# Patient Record
Sex: Male | Born: 1980 | Race: White | Hispanic: No | Marital: Married | Smoking: Former smoker
Health system: Southern US, Community
[De-identification: ages and names within clinical notes are randomized; demographics above are authoritative.]

---

## 2015-11-10 ENCOUNTER — Emergency Department (HOSPITAL_COMMUNITY)
Admission: EM | Admit: 2015-11-10 | Discharge: 2015-11-10 | Disposition: A | Discharge status: Home / Self Care | Attending: Emergency Medicine | Admitting: Emergency Medicine

## 2015-11-10 ENCOUNTER — Emergency Department (HOSPITAL_COMMUNITY)

## 2015-11-10 ENCOUNTER — Encounter (HOSPITAL_COMMUNITY): Payer: Self-pay | Admitting: Oncology

## 2015-11-10 DIAGNOSIS — Y998 Other external cause status: Secondary | ICD-10-CM | POA: Insufficient documentation

## 2015-11-10 DIAGNOSIS — Y9289 Other specified places as the place of occurrence of the external cause: Secondary | ICD-10-CM | POA: Insufficient documentation

## 2015-11-10 DIAGNOSIS — Y9389 Activity, other specified: Secondary | ICD-10-CM | POA: Diagnosis not present

## 2015-11-10 DIAGNOSIS — Z880 Allergy status to penicillin: Secondary | ICD-10-CM | POA: Insufficient documentation

## 2015-11-10 DIAGNOSIS — S6992XA Unspecified injury of left wrist, hand and finger(s), initial encounter: Secondary | ICD-10-CM | POA: Diagnosis present

## 2015-11-10 DIAGNOSIS — Z87891 Personal history of nicotine dependence: Secondary | ICD-10-CM | POA: Diagnosis not present

## 2015-11-10 DIAGNOSIS — S63602A Unspecified sprain of left thumb, initial encounter: Secondary | ICD-10-CM | POA: Insufficient documentation

## 2015-11-10 DIAGNOSIS — W1839XA Other fall on same level, initial encounter: Secondary | ICD-10-CM | POA: Insufficient documentation

## 2015-11-10 MED ORDER — IBUPROFEN 600 MG PO TABS: 600.0000 mg | ORAL_TABLET | Freq: Four times a day (QID) | ORAL | Status: AC | PRN

## 2015-11-10 NOTE — Discharge Instructions (Signed)
Finger Sprain A finger sprain is a tear in one of the strong, fibrous tissues that connect the bones (ligaments) in your finger. The severity of the sprain depends on how much of the ligament is torn. The tear can be either partial or complete. CAUSES  Often, sprains are a result of a fall or accident. If you extend your hands to catch an object or to protect yourself, the force of the impact causes the fibers of your ligament to stretch too much. This excess tension causes the fibers of your ligament to tear. SYMPTOMS  You may have some loss of motion in your finger. Other symptoms include:  Bruising.  Tenderness.  Swelling. DIAGNOSIS  In order to diagnose finger sprain, your caregiver will physically examine your finger or thumb to determine how torn the ligament is. Your caregiver may also suggest an X-ray exam of your finger to make sure no bones are broken. TREATMENT  If your ligament is only partially torn, treatment usually involves keeping the finger in a fixed position (immobilization) for a short period. To do this, your caregiver will apply a bandage, cast, or splint to keep your finger from moving until it heals. For a partially torn ligament, the healing process usually takes 2 to 3 weeks. If your ligament is completely torn, you may need surgery to reconnect the ligament to the bone. After surgery a cast or splint will be applied and will need to stay on your finger or thumb for 4 to 6 weeks while your ligament heals. HOME CARE INSTRUCTIONS  Keep your injured finger elevated, when possible, to decrease swelling.  To ease pain and swelling, apply ice to your joint twice a day, for 2 to 3 days:  Put ice in a plastic bag.  Place a towel between your skin and the bag.  Leave the ice on for 15 minutes.  Only take over-the-counter or prescription medicine for pain as directed by your caregiver.  Do not wear rings on your injured finger.  Do not leave your finger unprotected  until pain and stiffness go away (usually 3 to 4 weeks).  Do not allow your cast or splint to get wet. Cover your cast or splint with a plastic bag when you shower or bathe. Do not swim.  Your caregiver may suggest special exercises for you to do during your recovery to prevent or limit permanent stiffness. SEEK IMMEDIATE MEDICAL CARE IF:  Your cast or splint becomes damaged.  Your pain becomes worse rather than better. MAKE SURE YOU:  Understand these instructions.  Will watch your condition.  Will get help right away if you are not doing well or get worse.   This information is not intended to replace advice given to you by your health care provider. Make sure you discuss any questions you have with your health care provider.   Document Released: 10/30/2004 Document Revised: 10/13/2014 Document Reviewed: 05/26/2011 Elsevier Interactive Patient Education 2016 Elsevier Inc.  

## 2015-11-10 NOTE — ED Notes (Signed)
Pt c/o left thumb pain after a fall yesterday that caused his thumb to bend backwards.  Pt rates pain 1/10.  Mild edema noted to area.

## 2015-11-10 NOTE — ED Provider Notes (Signed)
CSN: [id number]     Arrival date & time 11/10/15  1936 History  By signing my name below, I, Emmanuella Mensah, attest that this documentation has been prepared under the direction and in the presence of Marlon Pel, PA-C. Electronically Signed: Angelene Giovanni, ED Scribe. 11/10/2015. 8:47 PM.    Chief Complaint  Patient presents with  . Hand Pain   The history is provided by the patient. No language interpreter was used.   HPI Comments: Larry Contreras is a 35 y.o. male who presents to the Emergency Department complaining of partially improving constant left thumb pain s/p fall that occurred yesterday. He reports associated thumb swelling. He explains that he was pushing something and it pushed back on him causing him to fall on his thumb as it was bending backwards. No LOC or head injuries during the fall. No alleviating factors noted. Pt has not taken any medications for these symptoms PTA. No fever, chills, numbness, tingling, or n/v.    History reviewed. No pertinent past medical history. History reviewed. No pertinent past surgical history. No family history on file. Social History  Substance Use Topics  . Smoking status: Former Games developer  . Smokeless tobacco: Never Used  . Alcohol Use: Yes     Comment: socially    Review of Systems  Constitutional: Negative for fever and chills.  Gastrointestinal: Negative for nausea and vomiting.  Musculoskeletal: Positive for arthralgias (left thumb).  Neurological: Negative for numbness.  All other systems reviewed and are negative.     Allergies  Penicillins  Home Medications   Prior to Admission medications   Medication Sig Start Date End Date Taking? Authorizing Provider  ibuprofen (ADVIL,MOTRIN) 600 MG tablet Take 1 tablet (600 mg total) by mouth every 6 (six) hours as needed. 11/10/15   Tylik Treese Neva Seat, PA-C   BP 125/78 mmHg  Pulse 92  Temp(Src) 97.9 F (36.6 C) (Oral)  Resp 18  SpO2 98% Physical Exam  Constitutional: He is  oriented to person, place, and time. He appears well-developed and well-nourished. No distress.  HENT:  Head: Normocephalic and atraumatic.  Eyes: Conjunctivae and EOM are normal.  Neck: Neck supple. No tracheal deviation present.  Cardiovascular: Normal rate.   Pulmonary/Chest: Effort normal. No respiratory distress.  Musculoskeletal: Normal range of motion.  Tenderness to the proximal phalanx. Mild swelling, no erythema. No fluctuance. No rash or skin injury. No deformities. NIV, CR < 2 seconds.  Neurological: He is alert and oriented to person, place, and time.  Skin: Skin is warm and dry.  Psychiatric: He has a normal mood and affect. His behavior is normal.  Nursing note and vitals reviewed.   ED Course  Procedures (including critical care time) DIAGNOSTIC STUDIES: Oxygen Saturation is 98% on RA, normal by my interpretation.    COORDINATION OF CARE: 8:43 PM- Pt advised of plan for treatment and pt agrees. Pt informed of x-ray results: x-ray shows no fracture. Pt's symptoms are consistent with a sprain. He was offered thumb spica but declined. Advised to use Ibuprofen, ice, and to stretch it out. Referral to Hand to be seen as needed. Discussed return precautions.   Imaging Review Dg Hand Complete Left  11/10/2015  CLINICAL DATA:  Left hand injury yesterday. Pt states he fell backwards and braced himself with left hand and left thumb bent backwards. Pain mostly through 1st and 2nd metacarpals. EXAM: LEFT HAND - COMPLETE 3+ VIEW COMPARISON:  None. FINDINGS: There is no evidence of fracture or dislocation. There is no evidence  of arthropathy or other focal bone abnormality. Soft tissues are unremarkable. IMPRESSION: Negative. Electronically Signed   By: Norva Pavlov M.D.   On: 11/10/2015 20:06   Marlon Pel, PA-C has personally reviewed and evaluated these images as part of her medical decision-making.   MDM   Final diagnoses:  Thumb sprain, left, initial encounter    I  personally performed the services described in this documentation, which was scribed in my presence. The recorded information has been reviewed and is accurate.   Marlon Pel, PA-C 11/10/15 2100  Margarita Grizzle, MD 11/14/15 1352

## 2016-08-03 IMAGING — CR DG HAND COMPLETE 3+V*L*
3 series · 3 of 3 positions shown · non-contrast
Comparison: None.

CLINICAL DATA: Left hand injury yesterday. Pt states he fell
backwards and braced himself with left hand and left thumb bent
backwards. Pain mostly through 1st and 2nd metacarpals.

EXAM:
LEFT HAND - COMPLETE 3+ VIEW

[x hand pa left]
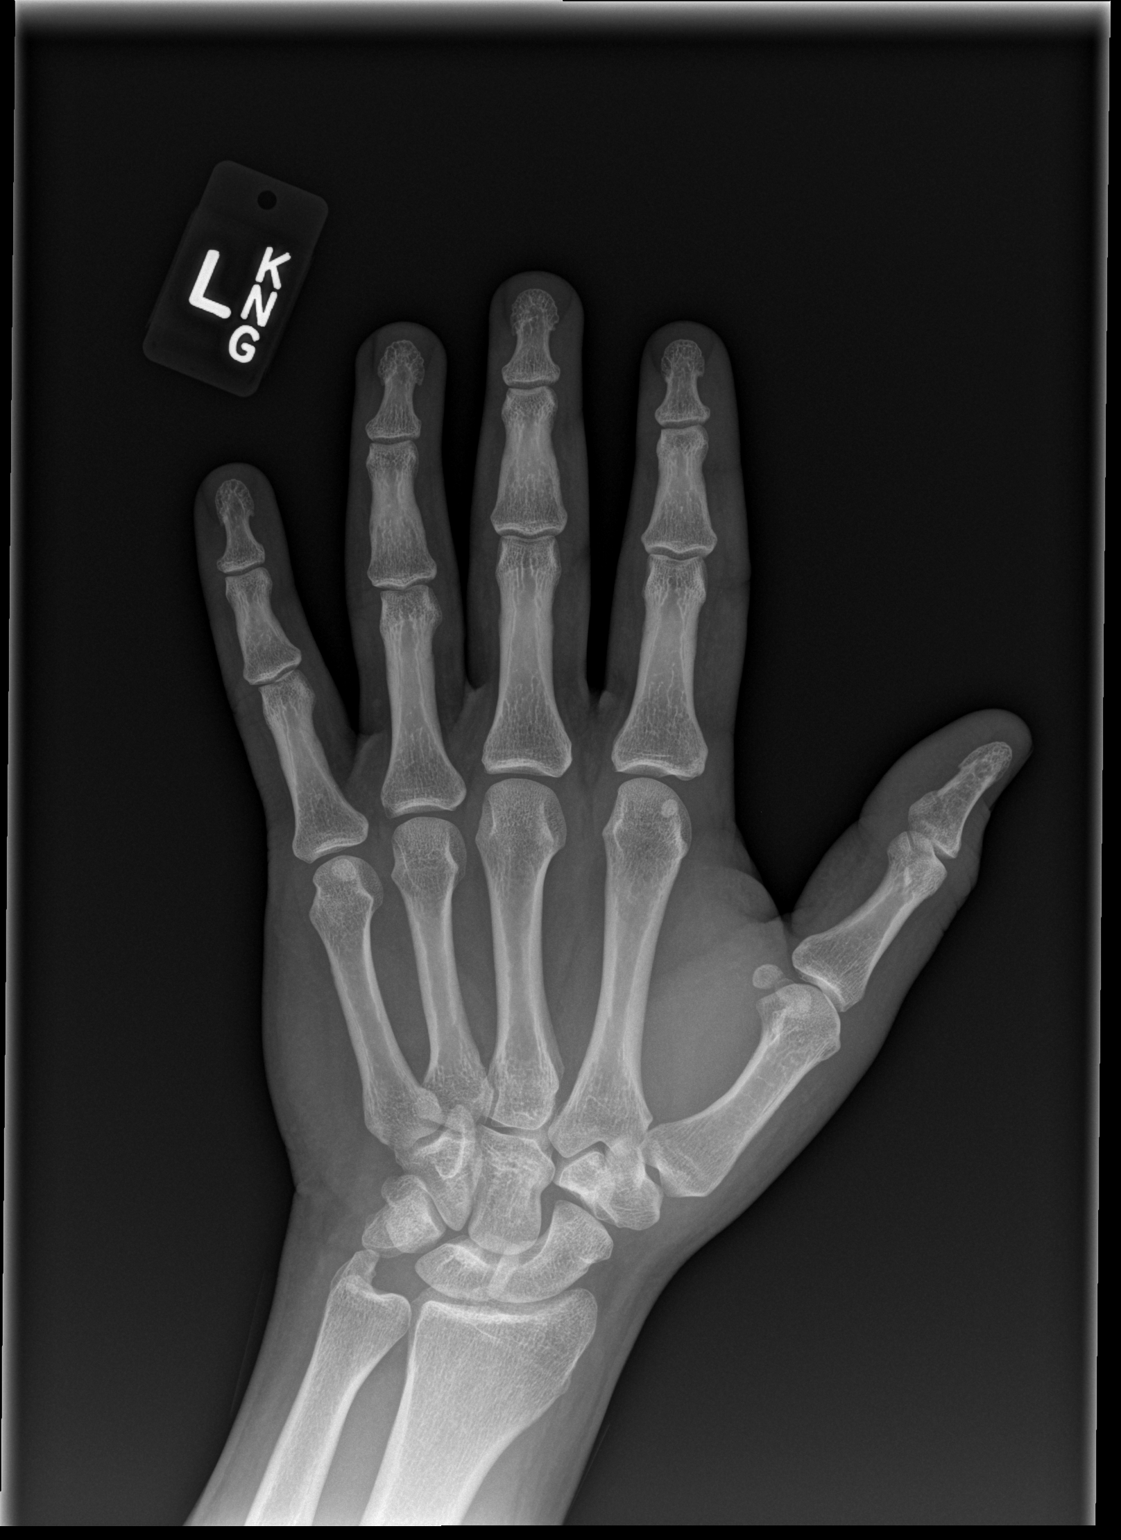

[x hand obl left]
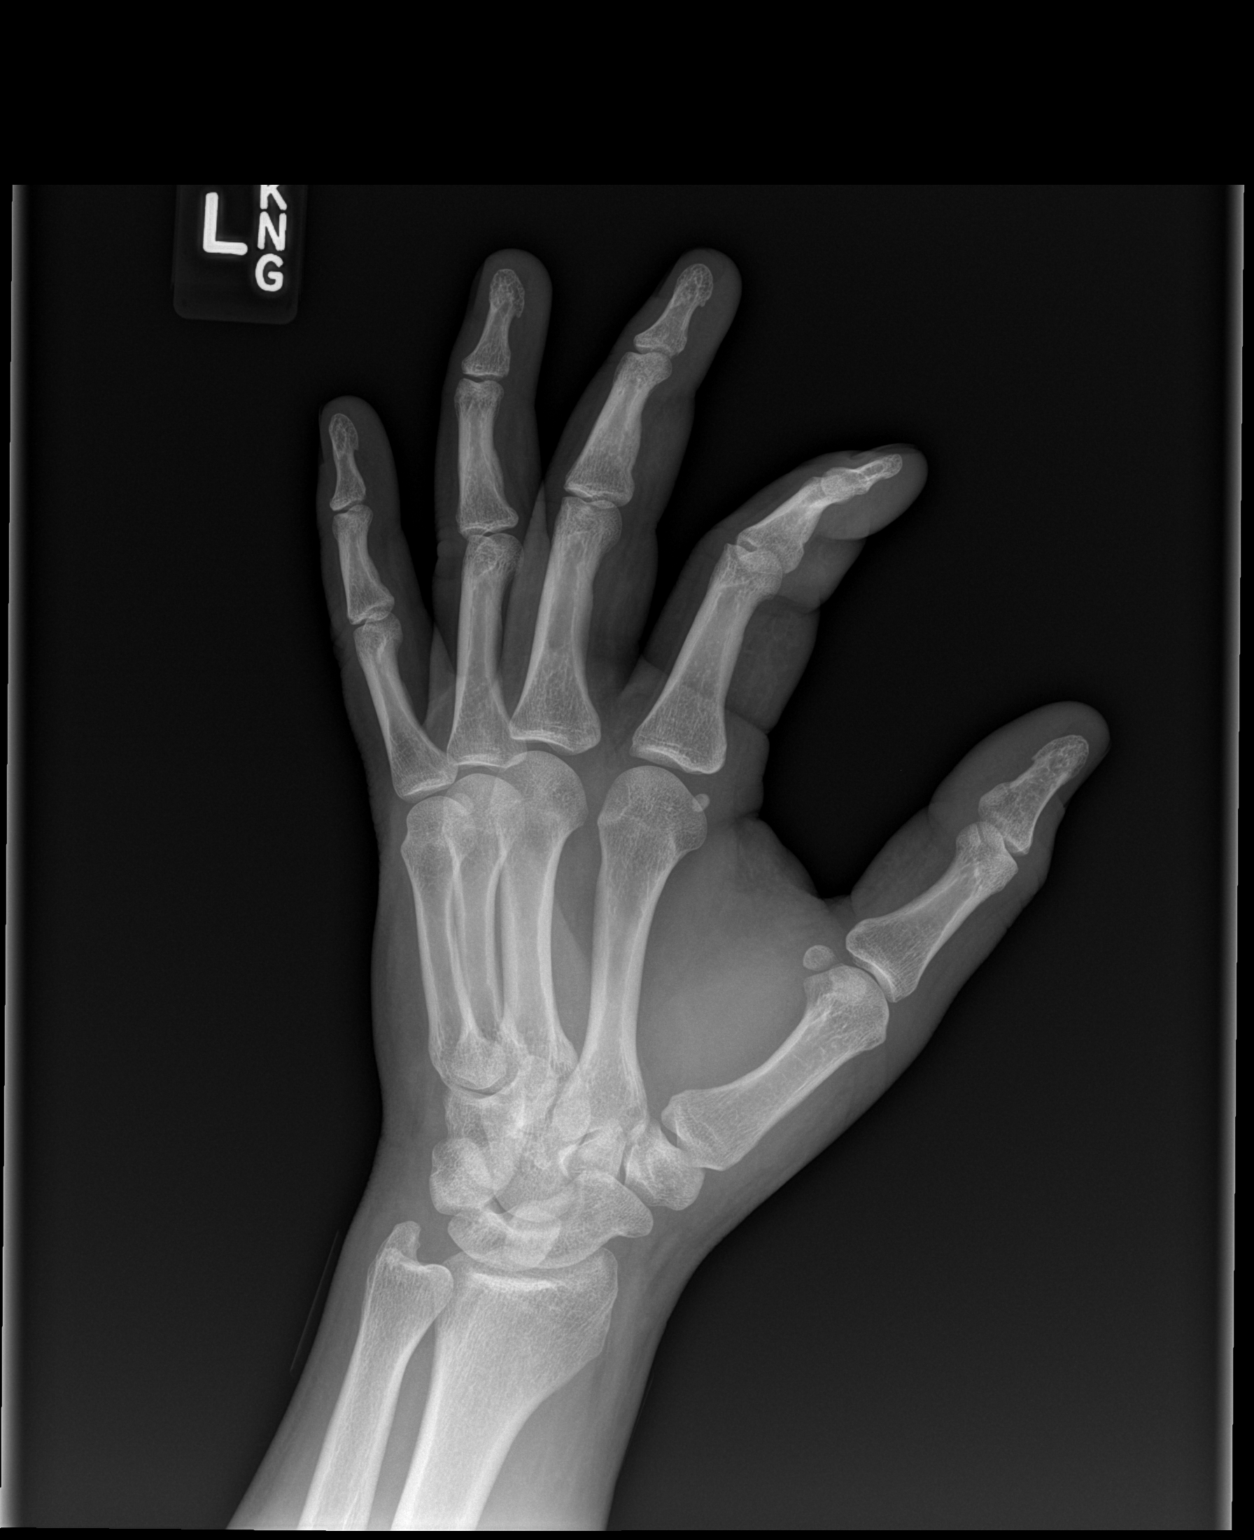

[x hand lat left]
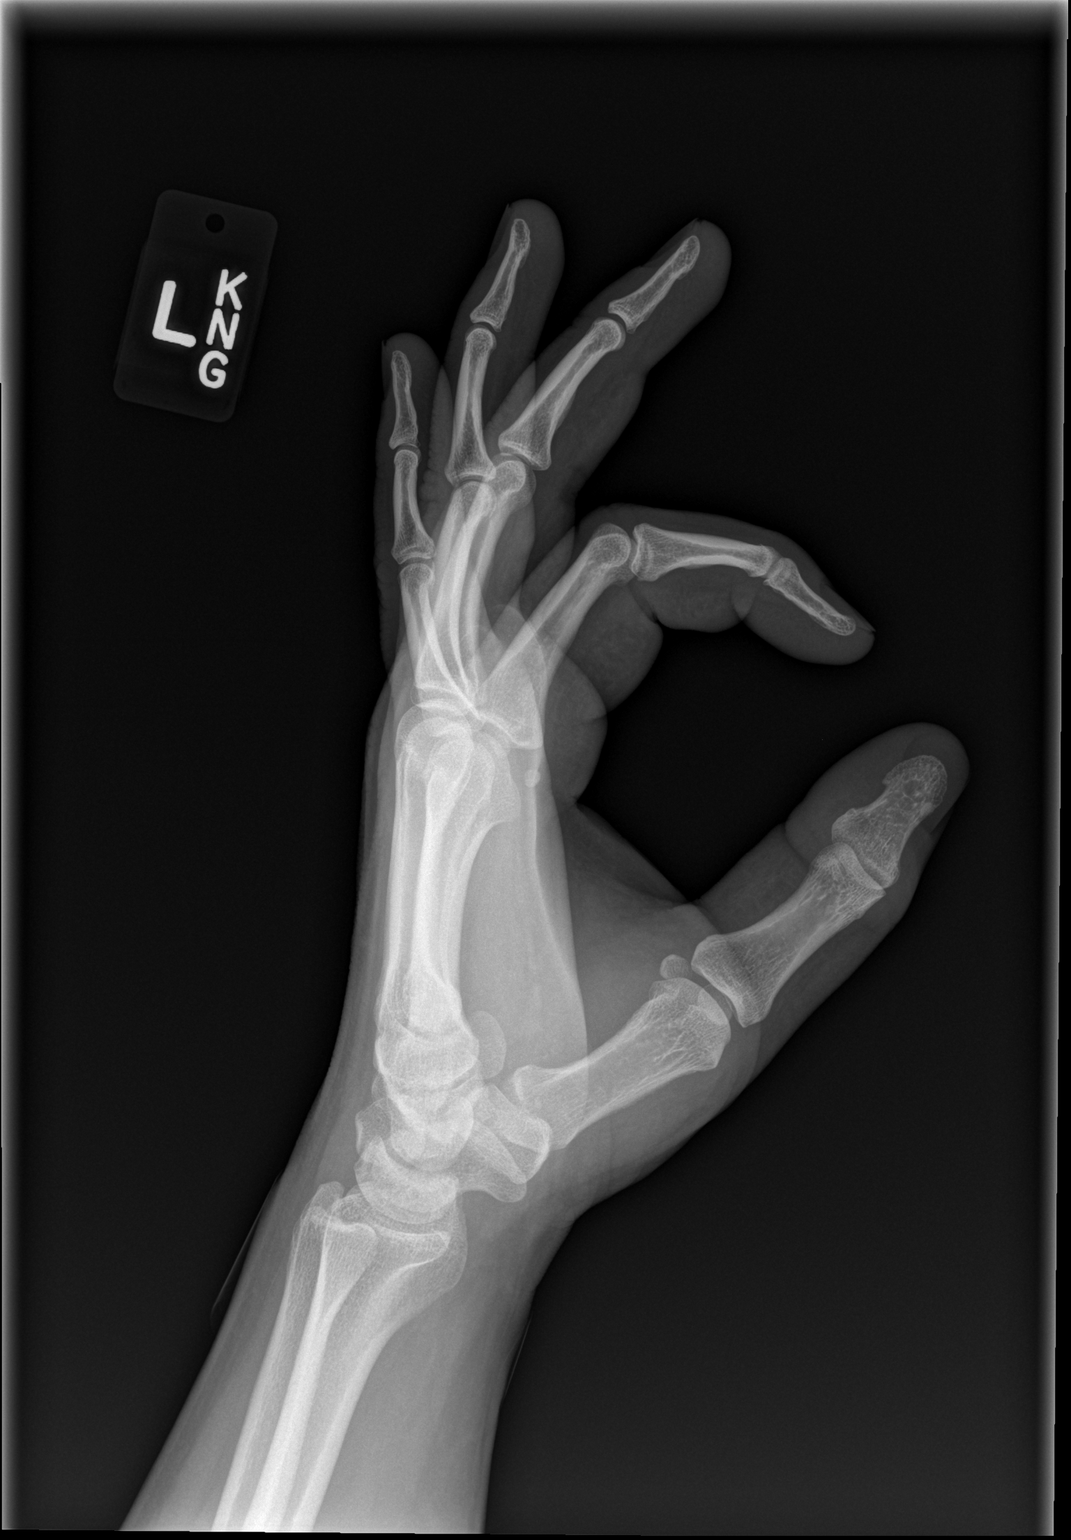

[3 of 3 positions shown; findings below may reference images not displayed]

FINDINGS: There is no evidence of fracture or dislocation. There is no
evidence of arthropathy or other focal bone abnormality. Soft
tissues are unremarkable.
IMPRESSION: Negative.

## 2021-04-16 ENCOUNTER — Other Ambulatory Visit (HOSPITAL_BASED_OUTPATIENT_CLINIC_OR_DEPARTMENT_OTHER): Payer: Self-pay

## 2021-04-16 MED ORDER — ARMODAFINIL 250 MG PO TABS
ORAL_TABLET | ORAL | 5 refills | Status: AC
  Filled 2021-04-16: qty 30, 30d supply, fill #0

## 2021-04-17 ENCOUNTER — Other Ambulatory Visit (HOSPITAL_BASED_OUTPATIENT_CLINIC_OR_DEPARTMENT_OTHER): Payer: Self-pay

## 2021-04-19 ENCOUNTER — Other Ambulatory Visit (HOSPITAL_BASED_OUTPATIENT_CLINIC_OR_DEPARTMENT_OTHER): Payer: Self-pay

## 2021-04-19 MED ORDER — ARMODAFINIL 200 MG PO TABS: ORAL_TABLET | ORAL | 5 refills | Status: AC

## 2021-04-19 MED ORDER — ARMODAFINIL 50 MG PO TABS
ORAL_TABLET | ORAL | 4 refills | Status: AC
  Filled 2021-04-19: qty 30, 30d supply, fill #0

## 2021-05-30 ENCOUNTER — Other Ambulatory Visit (HOSPITAL_BASED_OUTPATIENT_CLINIC_OR_DEPARTMENT_OTHER): Payer: Self-pay

## 2021-06-03 ENCOUNTER — Other Ambulatory Visit (HOSPITAL_BASED_OUTPATIENT_CLINIC_OR_DEPARTMENT_OTHER): Payer: Self-pay

## 2021-06-03 MED ORDER — AMPHETAMINE-DEXTROAMPHETAMINE 10 MG PO TABS
ORAL_TABLET | ORAL | 0 refills | Status: AC
  Filled 2021-06-03: qty 60, 30d supply, fill #0

## 2021-06-03 MED ORDER — ARMODAFINIL 250 MG PO TABS
ORAL_TABLET | ORAL | 5 refills | Status: AC
  Filled 2021-06-03: qty 30, 30d supply, fill #0

## 2021-06-04 ENCOUNTER — Other Ambulatory Visit (HOSPITAL_BASED_OUTPATIENT_CLINIC_OR_DEPARTMENT_OTHER): Payer: Self-pay

## 2023-08-28 ENCOUNTER — Ambulatory Visit (INDEPENDENT_AMBULATORY_CARE_PROVIDER_SITE_OTHER): Admitting: Professional Counselor

## 2023-08-28 ENCOUNTER — Encounter: Payer: Self-pay | Admitting: Professional Counselor

## 2023-08-28 DIAGNOSIS — F330 Major depressive disorder, recurrent, mild: Secondary | ICD-10-CM | POA: Diagnosis not present

## 2023-08-28 DIAGNOSIS — F411 Generalized anxiety disorder: Secondary | ICD-10-CM | POA: Diagnosis not present
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
## 2023-09-07 ENCOUNTER — Encounter: Payer: Self-pay | Admitting: Professional Counselor

## 2023-09-07 ENCOUNTER — Ambulatory Visit: Admitting: Professional Counselor

## 2023-09-07 DIAGNOSIS — F330 Major depressive disorder, recurrent, mild: Secondary | ICD-10-CM | POA: Diagnosis not present

## 2023-09-07 DIAGNOSIS — F411 Generalized anxiety disorder: Secondary | ICD-10-CM | POA: Diagnosis not present
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
## 2023-10-05 ENCOUNTER — Ambulatory Visit (INDEPENDENT_AMBULATORY_CARE_PROVIDER_SITE_OTHER): Admitting: Professional Counselor

## 2023-10-05 ENCOUNTER — Encounter: Payer: Self-pay | Admitting: Professional Counselor

## 2023-10-05 DIAGNOSIS — F411 Generalized anxiety disorder: Secondary | ICD-10-CM

## 2023-10-05 DIAGNOSIS — F330 Major depressive disorder, recurrent, mild: Secondary | ICD-10-CM | POA: Diagnosis not present

## 2023-10-05 NOTE — Progress Notes (Unsigned)
      [facility] Counselor/Therapist Progress Note  Patient ID: Larry Contreras, MRN: [id number],    Date: 10/05/2023  Time Spent: ***   Treatment Type: {CHL AMB THERAPY TYPES:[id number]}  Reported Symptoms: ***  Mental Status Exam:  Appearance:   {PSY:22683}     Behavior:  {PSY:21022743}  Motor:  {PSY:22302}  Speech/Language:   {PSY:22685}  Affect:  {PSY:22687}  Mood:  {PSY:31886}  Thought process:  {PSY:31888}  Thought content:    {PSY:[id number]}  Sensory/Perceptual disturbances:    {PSY:772-735-3887}  Orientation:  {PSY:30297}  Attention:  {PSY:22877}  Concentration:  {[phone]}  Memory:  {PSY:[id number]}  Fund of knowledge:   {[phone]}  Insight:    {[phone]}  Judgment:   {[phone]}  Impulse Control:  {[phone]}   Risk Assessment: Danger to Self:  {PSY:22692} Self-injurious Behavior: {PSY:22692} Danger to Others: {PSY:22692} Duty to Warn:{PSY:311194} Physical Aggression / Violence:{PSY:21197} Access to Firearms a concern: {PSY:21197} Gang Involvement:{PSY:21197}  Subjective: ***   Interventions: {PSY:[phone]}  Diagnosis:   ICD-10-CM   1. Generalized anxiety disorder  F41.1     2. Major depressive disorder, recurrent episode, mild (HCC)  F33.0       Plan: ***  Gaspar Bidding, [facility]

## 2023-10-22 ENCOUNTER — Encounter: Payer: Self-pay | Admitting: Professional Counselor

## 2023-10-22 ENCOUNTER — Ambulatory Visit: Admitting: Professional Counselor

## 2023-10-22 DIAGNOSIS — F330 Major depressive disorder, recurrent, mild: Secondary | ICD-10-CM | POA: Diagnosis not present

## 2023-10-22 DIAGNOSIS — F411 Generalized anxiety disorder: Secondary | ICD-10-CM | POA: Diagnosis not present
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
## 2023-11-02 ENCOUNTER — Ambulatory Visit: Admitting: Professional Counselor

## 2023-11-02 DIAGNOSIS — F330 Major depressive disorder, recurrent, mild: Secondary | ICD-10-CM | POA: Diagnosis not present

## 2023-11-02 DIAGNOSIS — F411 Generalized anxiety disorder: Secondary | ICD-10-CM

## 2023-11-04 ENCOUNTER — Other Ambulatory Visit: Payer: Self-pay | Admitting: Medical Genetics

## 2023-11-10 ENCOUNTER — Other Ambulatory Visit (HOSPITAL_COMMUNITY)
Admission: RE | Admit: 2023-11-10 | Discharge: 2023-11-10 | Disposition: A | Discharge status: Home / Self Care | Payer: Self-pay | Source: Ambulatory Visit | Attending: Oncology | Admitting: Oncology

## 2023-11-22 LAB — GENECONNECT MOLECULAR SCREEN: Genetic Analysis Overall Interpretation: NEGATIVE

## 2023-11-27 ENCOUNTER — Encounter: Payer: Self-pay | Admitting: Professional Counselor

## 2023-11-27 NOTE — Progress Notes (Signed)
      [facility] Counselor/Therapist Progress Note  Patient ID: Larry Contreras, MRN: [id number],    Date: 1.27.2025  Time Spent: 1:06 PM to 2:07 PM  Treatment Type: Individual Therapy  Reported Symptoms: Low mood, irritability, frustration, worries, trouble relaxing  Mental Status Exam:  Appearance:   Neat     Behavior:  Appropriate, Sharing, and Motivated  Motor:  Normal  Speech/Language:   Clear and Coherent and Normal Rate  Affect:  Appropriate and Congruent  Mood:  normal  Thought process:  normal  Thought content:    WNL  Sensory/Perceptual disturbances:    WNL  Orientation:  oriented to person, place, time/date, and situation  Attention:  Good  Concentration:  Good  Memory:  WNL  Fund of knowledge:   Good  Insight:    Good  Judgment:   Good  Impulse Control:  Good   Risk Assessment: Danger to Self:  No Self-injurious Behavior: No Danger to Others: No Duty to Warn:no Physical Aggression / Violence:No  Access to Firearms a concern: No  Gang Involvement:No   Subjective: Patient presented to session to address concerns of anxiety and depression.  He reported minimal progress at this time.  Patient presented to session in low spirits after engagement with peers and a professor in his schooling where he experienced a sense of targeting to his personal values.  Patient identified this as a theme he has experienced various points across his life, resulting in a sense of alienation.  Counselor affirmed patient feelings and experience, actively listened, and helped facilitate insight around patient position in conversation, and reflect on possible issue of delivery versus content as the heart of the matter, and/or mirror difference of opinion that resulted in friction.  Counselor and patient discussed different narratives and insights, and counselor encouraged patient to persevere in challenging contexts and work to not allow uncomfortable differences to dissuade him from  achieving his goals.  Interventions: Solution-Oriented/Positive Psychology, Humanistic/Existential, and Insight-Oriented  Diagnosis:   ICD-10-CM   1. Generalized anxiety disorder  F41.1     2. Major depressive disorder, recurrent episode, mild (HCC)  F33.0       Plan: Patient is scheduled for follow-up; continue process work and developing coping skills.  Patient short-term goal between sessions to reflect on rightness of his speaking of his own truth, values, opinion, while also reflecting on approach and delivery and potentially conflictual engagements.  Progress note was dictated with Dragon and reviewed for accuracy.  Gaspar Bidding, [facility]

## 2023-12-15 ENCOUNTER — Ambulatory Visit: Admitting: Professional Counselor

## 2023-12-15 ENCOUNTER — Encounter: Payer: Self-pay | Admitting: Professional Counselor

## 2023-12-15 DIAGNOSIS — F330 Major depressive disorder, recurrent, mild: Secondary | ICD-10-CM | POA: Diagnosis not present

## 2023-12-15 DIAGNOSIS — F411 Generalized anxiety disorder: Secondary | ICD-10-CM

## 2023-12-15 NOTE — Progress Notes (Signed)
      [facility] Counselor/Therapist Progress Note  Patient ID: Larry Contreras, MRN: [id number],    Date: 12/15/2023  Time Spent: 2:10 PM to 3:14 PM  Treatment Type: Individual Therapy  Reported Symptoms: Irritability, nervousness, worries, trouble relaxing, intermittent low mood, preemptive grief/loss  Mental Status Exam:  Appearance:   Neat     Behavior:  Appropriate, Sharing, and Motivated  Motor:  Normal  Speech/Language:   Clear and Coherent and Normal Rate  Affect:  Appropriate and Congruent  Mood:  normal  Thought process:  normal  Thought content:    WNL  Sensory/Perceptual disturbances:    WNL  Orientation:  oriented to person, place, time/date, and situation  Attention:  Good  Concentration:  Good  Memory:  WNL  Fund of knowledge:   Good  Insight:    Good  Judgment:   Good  Impulse Control:  Good   Risk Assessment: Danger to Self:  No Self-injurious Behavior: No Danger to Others: No Duty to Warn:no Physical Aggression / Violence:No  Access to Firearms a concern: No  Gang Involvement:No   Subjective: Patient presented to session to address concerns of anxiety, depression, and emotional dysregulation (anger).  Patient reported mixed progress.  He reported having been on vacation to see 3 of his 4 sisters, and for the trip to have gone very well.  He voiced having practiced mindfulness and CBT skills in relation to family matters that have been historically activating for him.  He reported experiencing progress with emotional regulation, but to still become agitated internally and spite of outward control.  Counselor affirmed patient proactive efforts and reinforced use of skill set.  Patient reflected on his family history, including his [place] and [place] background, and his reverence for his grandfather; he identified additional themes of emotional distancing and rigidity within family system, particularly with his grandmother.  Counselor actively listened, and  helped facilitate insight into potential impact on patient development and relational style.  Interventions: Solution-Oriented/Positive Psychology, Humanistic/Existential, and Insight-Oriented  Diagnosis:   ICD-10-CM   1. Generalized anxiety disorder  F41.1     2. Major depressive disorder, recurrent episode, mild (HCC)  F33.0       Plan: Patient is scheduled for follow-up; continue process work and developing coping skills.  Patient short-term goal between sessions to continue to practice emotional regulation, mindfulness and CBT skill set.  Progress note was dictated with Dragon and reviewed for accuracy.  Gaspar Bidding, [facility]

## 2023-12-22 ENCOUNTER — Ambulatory Visit: Admitting: Professional Counselor

## 2023-12-29 ENCOUNTER — Encounter: Payer: Self-pay | Admitting: Professional Counselor

## 2023-12-29 ENCOUNTER — Ambulatory Visit: Admitting: Professional Counselor

## 2023-12-29 DIAGNOSIS — F330 Major depressive disorder, recurrent, mild: Secondary | ICD-10-CM | POA: Diagnosis not present

## 2023-12-29 DIAGNOSIS — F411 Generalized anxiety disorder: Secondary | ICD-10-CM

## 2023-12-29 NOTE — Progress Notes (Addendum)
      [facility] Counselor/Therapist Progress Note  Patient ID: Larry Contreras, MRN: [id number],    Date: 12/29/2023  Time Spent: 1:06 PM to 2:08 PM  Treatment Type: Individual Therapy  Reported Symptoms: Nervousness, worries, irritability; intermittent low mood, anhedonia, self-esteem concerns; emotional dysregulation concerns, interpersonal concerns  Mental Status Exam:  Appearance:   Neat     Behavior:  Appropriate, Sharing, and Motivated  Motor:  Normal  Speech/Language:   Clear and Coherent and Normal Rate  Affect:  Tearful  Mood:  Sad, some agitation  Thought process:  normal  Thought content:    WNL  Sensory/Perceptual disturbances:    WNL  Orientation:  oriented to person, place, time/date, and situation  Attention:  Good  Concentration:  Good  Memory:  WNL  Fund of knowledge:   Good  Insight:    Good  Judgment:   Good  Impulse Control:  Good   Risk Assessment: Danger to Self:  No Self-injurious Behavior: No Danger to Others: No Duty to Warn:no Physical Aggression / Violence:No  Access to Firearms a concern: No  Gang Involvement:No   Subjective: Patient presented to session to address concerns of anxiety and depression, and anger management concerns.  Patient reported limited progress at this time.  He identified stress across spheres of life including academic pursuits, marriage and family, reflections on his past, and emotional dysregulation.  He processed experience of emotional dysregulation episodes since last session.  He processed experience of marital concerns, and also voiced having been reflecting on his emotions and his sense that he sometimes feels blocked from range of emotional capacities.  He voiced having been reflecting on discussion with therapy from last session regarding his grandmother and parents, and aspects of his upbringing where he identifies he could have needed increased nurture and presence from caregivers.  Counselor held space for patient  processing, and helped facilitate insight, develop strategies for interpersonal effectiveness and emotional regulation, and resourcing of patient strengths.  Counselor provided psychoeducation regarding definitions of anger, hostility and aggression and discussed with patient, and helped develop patient mindfulness of agency in the moment, and resourcing of self and other compassion per concept of common humanity.  Counselor helped to instill hope.  Counselor discussed possibility of couples counseling with patient.  Interventions: Solution-Oriented/Positive Psychology, Humanistic/Existential, and Insight-Oriented, Psychoeducation, Anger Management  Diagnosis:   ICD-10-CM   1. Generalized anxiety disorder  F41.1     2. Major depressive disorder, recurrent episode, mild (HCC)  F33.0       Plan: Patient is scheduled for follow-up; continue process work and developing coping skills.  Patient short-term goal between sessions to consider couples counseling, prioritize short-term and achievable goals to improve mood stability including working out, and continue to reflect on range of emotions including those underlying emotionally dysregulation states.  Progress note was dictated with Dragon and reviewed for accuracy.  Anthon Kins, [facility]

## 2024-01-05 ENCOUNTER — Encounter: Payer: Self-pay | Admitting: Professional Counselor

## 2024-01-05 ENCOUNTER — Ambulatory Visit (INDEPENDENT_AMBULATORY_CARE_PROVIDER_SITE_OTHER): Admitting: Professional Counselor

## 2024-01-05 DIAGNOSIS — F330 Major depressive disorder, recurrent, mild: Secondary | ICD-10-CM | POA: Diagnosis not present

## 2024-01-05 DIAGNOSIS — F411 Generalized anxiety disorder: Secondary | ICD-10-CM | POA: Diagnosis not present

## 2024-01-05 NOTE — Progress Notes (Signed)
      [facility] Counselor/Therapist Progress Note  Patient ID: Larry Contreras, MRN: [id number],    Date: 01/05/2024  Time Spent: 1:08 PM to 2:11 PM  Treatment Type: Individual Therapy  Reported Symptoms: Muscle tension, sense of bland feeling, frustration, agitation upon social cues, worries, interpersonal concerns  Mental Status Exam:  Appearance:   Neat     Behavior:  Appropriate, Sharing, and Motivated  Motor:  Normal  Speech/Language:   Clear and Coherent and Normal Rate  Affect:  Appropriate and Congruent  Mood:  normal  Thought process:  normal  Thought content:    WNL  Sensory/Perceptual disturbances:    WNL  Orientation:  oriented to person, place, time/date, and situation  Attention:  Good  Concentration:  Good  Memory:  WNL  Fund of knowledge:   Good  Insight:    Good  Judgment:   Good  Impulse Control:  Good   Risk Assessment: Danger to Self:  No Self-injurious Behavior: No Danger to Others: No Duty to Warn:no Physical Aggression / Violence:No  Access to Firearms a concern: No  Gang Involvement:No   Subjective: Patient presented to session to address concerns of anxiety and depression.  He reported mixed progress at this time.  He reflected on his desires for the future, particularly as relates ideas about future employment and/or retirement, and his marriage.  He voiced having confusing feelings about marital dynamics at this time.  Patient reported having taken suggestion from last session to take a walk after session to center and reflect.  Counselor actively listened and helped facilitate insight around patient concerns.  Patient identified internalizing reflecting and practicing therapeutic skills and to find them helpful, and that his schooling helps, too.  He reported working to emotionally regulate, and counselor reinforced patient proactive efforts as relate healthy and safe release of tension and aggravation.  Patient explored possible origin of some of his  need for social space and easy frustration in public, particularly as relates his father's demeanor at times.  Counselor helped with strategies such as interrupting activation by attuning with others and adopting a stance of friendliness versus hostility.  Interventions: Solution-Oriented/Positive Psychology, Humanistic/Existential, and Insight-Oriented, Anger Management  Diagnosis:   ICD-10-CM   1. Generalized anxiety disorder  F41.1     2. Major depressive disorder, recurrent episode, mild (HCC)  F33.0       Plan: Patient is scheduled for follow-up; continue process work and developing coping skills.  Patient short-term personal goal between sessions to practice adopting friendly stance when faced with potential for social agitation.  Progress note was dictated with Dragon and reviewed for accuracy.  Anthon Kins, [facility]

## 2024-01-27 ENCOUNTER — Encounter: Payer: Self-pay | Admitting: Professional Counselor

## 2024-01-27 ENCOUNTER — Ambulatory Visit (INDEPENDENT_AMBULATORY_CARE_PROVIDER_SITE_OTHER): Admitting: Professional Counselor

## 2024-01-27 DIAGNOSIS — F411 Generalized anxiety disorder: Secondary | ICD-10-CM

## 2024-01-27 DIAGNOSIS — F330 Major depressive disorder, recurrent, mild: Secondary | ICD-10-CM | POA: Diagnosis not present

## 2024-01-27 NOTE — Progress Notes (Signed)
      [facility] Counselor/Therapist Progress Note  Patient ID: Larry Contreras, MRN: [id number],    Date: 01/27/2024  Time Spent: 10:13 AM to 11:15 AM  Treatment Type: Individual Therapy  Reported Symptoms: Restlessness, irritability, low mood, worries, interpersonal concerns, emotional dysregulation concerns  Mental Status Exam:  Appearance:   Neat     Behavior:  Appropriate and Sharing  Motor:  Normal  Speech/Language:   Clear and Coherent and Normal Rate  Affect:  Appropriate and Congruent  Mood:  normal  Thought process:  normal  Thought content:    WNL  Sensory/Perceptual disturbances:    WNL  Orientation:  oriented to person, place, time/date, and situation  Attention:  Good  Concentration:  Good  Memory:  WNL  Fund of knowledge:   Good  Insight:    Good  Judgment:   Good  Impulse Control:  Good   Risk Assessment: Danger to Self:  No Self-injurious Behavior: No Danger to Others: No Duty to Warn:no Physical Aggression / Violence:No  Access to Firearms a concern: No  Gang Involvement:No   Subjective: Patient presented to session to address concerns of anxiety depression and anger management.  He reported mixed progress at this time.  Patient processed experience of difficult communications with his wife around issue of marital challenges.  Patient voiced desire to move forward in marriage optimally, and reconnect with the good things about their relationship, and to identify what it is he truly wants, and what he wishes to model in his family life particularly as a parent.  He voiced wife to be starting therapy as well, which he feels will help, too.  Counselor recommended couples counseling, and encouraged patient to maintain hopefulness and a long view.  Patient processed feedback from his spouse regarding "softening" his approach, and counselor and patient discussed strategies to this effect.  Counselor encouraged patient to be mindful of and pull back from activating  comments, and to practice friendliness across spheres of life as a way to diffuse internal agitation and tension and/or interpersonal escalation.  Interventions: Solution-Oriented/Positive Psychology, Humanistic/Existential, Insight-Oriented, and Anger Management  Diagnosis:   ICD-10-CM   1. Generalized anxiety disorder  F41.1     2. Major depressive disorder, recurrent episode, mild (HCC)  F33.0       Plan: Patient is scheduled for follow-up; continue process work and developing coping skills.  Patient short-term goal between sessions to be mindful of non-activating approach to interpersonal interactions, practice friendliness, and practice self-regulation skills.  Progress note was dictated with Dragon and reviewed for accuracy.  Anthon Kins, [facility]

## 2024-02-11 ENCOUNTER — Ambulatory Visit: Admitting: Professional Counselor

## 2024-02-16 ENCOUNTER — Ambulatory Visit: Admitting: Professional Counselor

## 2024-03-02 ENCOUNTER — Ambulatory Visit (INDEPENDENT_AMBULATORY_CARE_PROVIDER_SITE_OTHER): Admitting: Professional Counselor

## 2024-03-02 ENCOUNTER — Encounter: Payer: Self-pay | Admitting: Professional Counselor

## 2024-03-02 DIAGNOSIS — F411 Generalized anxiety disorder: Secondary | ICD-10-CM

## 2024-03-02 NOTE — Progress Notes (Signed)
      [facility] Counselor/Therapist Progress Note  Patient ID: Larry Contreras, MRN: [id number],    Date: 03/02/2024  Time Spent: 10:13 AM to 11:18 AM  Treatment Type: Individual Therapy  Reported Symptoms: Muscle tension, nervousness, restlessness, irritability, sense of overwhelm, anger concerns  Mental Status Exam:  Appearance:   Neat     Behavior:  Appropriate, Sharing, and Motivated  Motor:  Normal  Speech/Language:   Clear and Coherent and Normal Rate  Affect:  Appropriate and Congruent  Mood:  normal  Thought process:  normal  Thought content:    WNL  Sensory/Perceptual disturbances:    WNL  Orientation:  oriented to person, place, time/date, and situation  Attention:  Good  Concentration:  Good  Memory:  WNL  Fund of knowledge:   Good  Insight:    Good  Judgment:   Good  Impulse Control:  Good   Risk Assessment: Danger to Self:  No Self-injurious Behavior: No Danger to Others: No Duty to Warn:no Physical Aggression / Violence:No  Access to Firearms a concern: No  Gang Involvement:No   Subjective: Patient presented to session to address concerns of anxiety and anger management concerns.  He reported mixed progress at this time.  He reported practicing cognitive restructuring around experience of irritations, and working to let go, and being mindful about an empathetic approach to situations.  He reported improved communications with his immediate and extended family.  Counselor affirmed and reinforced patient proactive use of skills.  Patient reflected on his sense of his EQ and counselor actively listened and helped facilitate insight around patient concerns.  Counselor and patient discussed egoic versus collaborative orientations and choices, and counselor provided psychoeducation regarding your neurons, attunement, and assisted patient with developing strategies for coping with anger impulses pertaining to interactions with others.  Interventions:  Solution-Oriented/Positive Psychology, Humanistic/Existential, Psycho-education/Bibliotherapy, and Insight-Oriented  Diagnosis:   ICD-10-CM   1. Generalized anxiety disorder  F41.1       Plan: Patient is scheduled for follow-up; continue process work and developing coping skills.  STG between sessions to continue to practice coping skills and to reflect on self awareness and insights discussed in session.  Progress note was dictated with Dragon and reviewed for accuracy.  Larry Contreras, [facility]

## 2024-03-15 ENCOUNTER — Ambulatory Visit: Admitting: Professional Counselor

## 2024-03-15 ENCOUNTER — Encounter: Payer: Self-pay | Admitting: Professional Counselor

## 2024-03-15 DIAGNOSIS — F330 Major depressive disorder, recurrent, mild: Secondary | ICD-10-CM

## 2024-03-15 DIAGNOSIS — F411 Generalized anxiety disorder: Secondary | ICD-10-CM

## 2024-03-15 NOTE — Progress Notes (Signed)
      [facility] Counselor/Therapist Progress Note  Patient ID: Larry Contreras, MRN: [id number],    Date: 03/15/2024  Time Spent: 11:16 AM - 12:20 PM   Treatment Type: Individual Therapy  Reported Symptoms: restlessness, worries, nervousness, hypervigilance, irritability, emotional dysregulation, fatigue, unrefreshing sleep, fogginess  Mental Status Exam:  Appearance:   Neat     Behavior:  Appropriate, Sharing, and Motivated  Motor:  Normal  Speech/Language:   Clear and Coherent and Normal Rate  Affect:  Appropriate and Congruent  Mood:  normal  Thought process:  normal  Thought content:    WNL  Sensory/Perceptual disturbances:    WNL  Orientation:  oriented to person, place, time/date, and situation  Attention:  Good  Concentration:  Good  Memory:  WNL  Fund of knowledge:   Good  Insight:    Good  Judgment:   Good  Impulse Control:  Good   Risk Assessment: Danger to Self:  No Self-injurious Behavior: No Danger to Others: No Duty to Warn:no Physical Aggression / Violence:No  Access to Firearms a concern: No  Gang Involvement:No   Subjective: Patient presented to session to address concerns of anxiety and depression.  He reported mixed progress at this time.  Patient reported medications to possibly contributing to fatigue, fogginess and unrefreshed sleep.  He reported not feeling like himself.  Patient reflected on session being 10th with counselor and reflected on what he feels are his takeaways thus far.  He identified helpfulness of therapy in increasing how he conceptualizes his sense of his personal space and his experience of anger, how he approaches others, and an increase in awareness about impact and reflection of life experiences.  Counselor affirmed patient feelings and experience and shared her sense of patient progress including patient voicing of increase in capacity for emotional regulation and comfort in social interactions, openness to past/depth work,  and engagement in therapeutic relationship.  Counselor and patient continue to discuss internal activation around agitating social cues, and discussed patient concern regarding social anxiety and/or agoraphobia.  Counselor assessed symptoms with patient and voiced intention to continue to track with patient.  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  Counselor helped to facilitate insight and resource strengths and coping skills.  Interventions: Solution-Oriented/Positive Psychology, Humanistic/Existential, Insight-Oriented, and Anger Management, MBSR   Diagnosis:   ICD-10-CM   1. Generalized anxiety disorder  F41.1     2. Major depressive disorder, recurrent episode, mild (HCC)  F33.0       Plan: Pt is scheduled for a follow-up; continue process work and developing coping skills. Continue to discuss symptoms of agoraphobia and trauma response pattern. Pt STG between sessions to work on grounding preventively ahead of potentially triggering scenarios per breathwork techniques such as elevator breathing or body scan as discussed in session, practice visualization of boundaries and practicing mindfulness around choices to deescalate in the moment, reflect on his experience of relational traumas by hx and impact to development and well-being, including as relate his experience of altercations.  [name], [facility]

## 2024-03-29 ENCOUNTER — Ambulatory Visit: Admitting: Professional Counselor

## 2024-05-04 ENCOUNTER — Ambulatory Visit (INDEPENDENT_AMBULATORY_CARE_PROVIDER_SITE_OTHER): Admitting: Professional Counselor

## 2024-05-04 ENCOUNTER — Encounter: Payer: Self-pay | Admitting: Professional Counselor

## 2024-05-04 DIAGNOSIS — F411 Generalized anxiety disorder: Secondary | ICD-10-CM

## 2024-05-04 DIAGNOSIS — F330 Major depressive disorder, recurrent, mild: Secondary | ICD-10-CM | POA: Diagnosis not present

## 2024-05-04 NOTE — Progress Notes (Signed)
      [facility] Counselor/Therapist Progress Note  Patient ID: Larry Contreras, MRN: [id number],    Date: 05/04/2024  Time Spent: 3:04 PM to 4:06 PM  Treatment Type: Individual Therapy  Reported Symptoms: Nervousness, anxiousness, worries, trouble relaxing, irritability, intermittent low mood, fatigue  Mental Status Exam:  Appearance:   Neat     Behavior:  Appropriate, Sharing, and Motivated  Motor:  Normal  Speech/Language:   Clear and Coherent and Normal Rate  Affect:  Appropriate and Congruent  Mood:  normal  Thought process:  normal  Thought content:    WNL  Sensory/Perceptual disturbances:    WNL  Orientation:  oriented to person, place, time/date, and situation  Attention:  Good  Concentration:  Good  Memory:  WNL  Fund of knowledge:   Good  Insight:    Good  Judgment:   Good  Impulse Control:  Good   Risk Assessment: Danger to Self:  No Self-injurious Behavior: No Danger to Others: No Duty to Warn:no Physical Aggression / Violence:No  Access to Firearms a concern: No  Gang Involvement:No   Subjective: Patient presented to session to address concerns of anxiety and depression.  He reported mixed progress at this time.  Patient reported to be paying more attention to himself somatically, and how his attentiveness allows him to reset and find balance as needed.  He reflected on content from last session, voicing that he remembered times when people approached him and he may have not met others halfway; he voiced intention to continue to practice interpersonal effectiveness skills.  Patient reported his incidence of anger while driving to have improved, as he is practicing letting go of frustrations.  He reported working to sit with grief, and processed his relationship with his father by history, and other family members.  Counselor affirmed and reinforced patient proactive efforts, reflections and skill use, and helps to facilitate further insight.  Counselor and patient  discussed patient tendency for relational vigilance, and his sensory orientation, including as relates to his narcolepsy.  Counselor encouraged patient continued use of therapeutic journaling.  Interventions: Solution-Oriented/Positive Psychology, Humanistic/Existential, and Insight-Oriented  Diagnosis:   ICD-10-CM   1. Generalized anxiety disorder  F41.1     2. Major depressive disorder, recurrent episode, mild (HCC)  F33.0       Plan: Patient is scheduled for follow-up; continue process work and developing coping skills.  STG between sessions for patient to continue to reflect on self conceptualization and practice positive reframes, and continue self awareness around self/other projections.  Continue therapeutic journaling.  [name], [facility]

## 2024-05-18 ENCOUNTER — Encounter: Payer: Self-pay | Admitting: Professional Counselor

## 2024-05-18 ENCOUNTER — Ambulatory Visit (INDEPENDENT_AMBULATORY_CARE_PROVIDER_SITE_OTHER): Admitting: Professional Counselor

## 2024-05-18 DIAGNOSIS — F330 Major depressive disorder, recurrent, mild: Secondary | ICD-10-CM | POA: Diagnosis not present

## 2024-05-18 DIAGNOSIS — F411 Generalized anxiety disorder: Secondary | ICD-10-CM

## 2024-05-18 NOTE — Progress Notes (Signed)
      [facility] Counselor/Therapist Progress Note  Patient ID: Larry Contreras, MRN: [id number],    Date: 05/18/2024  Time Spent: 1:06 PM to 2:15 PM  Treatment Type: Individual Therapy  Reported Symptoms: Sadness, low mood, anxiousness, worries, trouble relaxing, irritability, interpersonal concerns, phase of life concerns, fatigue, self-esteem concerns  Mental Status Exam:  Appearance:   Neat     Behavior:  Appropriate and Sharing, Motivated  Motor:  Normal  Speech/Language:   Clear and Coherent and Normal Rate  Affect:  Appropriate and Congruent  Mood:  sad, frustrated  Thought process:  normal  Thought content:    WNL  Sensory/Perceptual disturbances:    WNL  Orientation:  oriented to person, place, time/date, and situation  Attention:  Good  Concentration:  Good  Memory:  WNL  Fund of knowledge:   Good  Insight:    Good  Judgment:   Good  Impulse Control:  Good   Risk Assessment: Danger to Self:  No Self-injurious Behavior: No Danger to Others: No Duty to Warn:no Physical Aggression / Violence:No  Access to Firearms a concern: No  Gang Involvement:No   Subjective: Patient presented to session to address concerns of anxiety and depression.  He reported mixed progress at this time.  He processed the experience of therapeutic journaling and voiced desire to explore more deeply in his therapy sessions.  He processed the experience of friendships by history, his relationship with his daughter particularly in her youth and challenges they have faced, his relationship with his father and ways of being and values he imparted, and lasting impact.  Counselor actively listened, affirmed patient feelings and experience, and helped facilitate insight into patient conceptualization of events and to encourage cognitive restructuring within varying perspectives. Counselor and patient discussed daughter phase of life by history, and her current bids for increased closeness, and strategies  to nurture growth and healing between them. Counselor and patient discussed patient forgiveness work as relates self and other compassion, and patient resourcing his sense of hope, voice, and life experience to help others in their intra/interpersonal journeys as well.   Interventions: Solution-Oriented/Positive Psychology, Humanistic/Existential, and Insight-Oriented  Diagnosis:   ICD-10-CM   1. Generalized anxiety disorder  F41.1     2. Major depressive disorder, recurrent episode, mild (HCC)  F33.0       Plan: Patient is scheduled for follow-up; continue process work and developing coping skills including grounding techniques skill set.  STG between sessions for patient to reflect on intrapersonal work of session and practice cognitive restructuring regarding specific narratives.  Larry Contreras, [facility]

## 2024-06-01 ENCOUNTER — Encounter: Payer: Self-pay | Admitting: Professional Counselor

## 2024-06-01 ENCOUNTER — Ambulatory Visit (INDEPENDENT_AMBULATORY_CARE_PROVIDER_SITE_OTHER): Admitting: Professional Counselor

## 2024-06-01 DIAGNOSIS — F411 Generalized anxiety disorder: Secondary | ICD-10-CM | POA: Diagnosis not present

## 2024-06-01 DIAGNOSIS — F330 Major depressive disorder, recurrent, mild: Secondary | ICD-10-CM | POA: Diagnosis not present

## 2024-06-01 NOTE — Progress Notes (Signed)
 [facility] Counselor/Therapist Progress Note  Patient ID: Larry Contreras, MRN: [id number],    Date: 06/01/2024  Time Spent: 10:10 AM to 11:12 AM  Treatment Type: Individual Therapy  Reported Symptoms: Fatigue, sadness, low mood, worries, interpersonal concerns, emotional dysregulation, irritability, frustration, trouble relaxing  Mental Status Exam:  Appearance:   Neat     Behavior:  Appropriate, Sharing, and Motivated  Motor:  Normal  Speech/Language:   Clear and Coherent and Normal Rate  Affect:  Tearful  Mood:  sad  Thought process:  normal  Thought content:    WNL  Sensory/Perceptual disturbances:    WNL  Orientation:  oriented to person, place, time/date, and situation  Attention:  Good  Concentration:  Good  Memory:  WNL  Fund of knowledge:   Good  Insight:    Good  Judgment:   Good  Impulse Control:  Good   Risk Assessment: Danger to Self:  No Self-injurious Behavior: No Danger to Others: No Duty to Warn:no Physical Aggression / Violence:No  Access to Firearms a concern: No  Gang Involvement:No   Subjective: Patient presented to session to address concerns of anxiety and depression.  He reported mixed progress at this time.  Patient reported school to have started again and to be going well, and to be working on a genogram.  He shared regarding a family conflict between himself and his son that then became between him and his wife.  He voiced frustration over the circumstance, and exasperation regarding times when he feels he tries to do his best, and regardless it seems hard to turn the tide of certain negative dynamics in the family system.  He expressed emotional confrontation was very tiring, and reflected on his capacity to compartmentalize emotions and experiences out of necessity.  Counselor assisted patient in identifying coping alternatives to interrupt potential emotional dysregulation preventatively.  Patient reflected on his father, and how accomplished  he was, and his reverence for his father's memory, and grief/lost for sense of missed closeness.  Counselor actively listened, affirmed patient feelings and experience, and helped to facilitate insight into patient coping mechanisms over time, possible limitations to patient being able to express feelings and be present by virtue of he and father's relational distance at times in patient's life, and helped patient to resource self and other compassion where complex father-son relationship is concerned, and to reinforce patient's trust in his own capacity to feel and to move through emotional states.    Interventions: Solution-Oriented/Positive Psychology, Humanistic/Existential, and Insight-Oriented, Resourcing  Diagnosis:   ICD-10-CM   1. Generalized anxiety disorder  F41.1     2. Major depressive disorder, recurrent episode, mild (HCC)  F33.0       Plan: Patient is scheduled for follow-up; continue process work and developing coping skills.  STG between sessions for patient to consider interrupting potential emotional dysregulation by resourcing distress tolerance skills of relaxation techniques, distracting techniques such as taking a walk, drinking a glass of water, or other healthy physical outlets, even brief such as a yoga sun salutation sequence.  Patient to continue to reflect on his relationship with his father, and utilizing positive self affirmations regarding his capacity to cope with and respond to his and others emotions effectively.  Patient to consider taking a walk in the sunshine after session to reflect on significant process work, and resource a self-care break.  [name], [facility]

## 2024-06-16 ENCOUNTER — Ambulatory Visit (INDEPENDENT_AMBULATORY_CARE_PROVIDER_SITE_OTHER): Admitting: Professional Counselor

## 2024-06-16 DIAGNOSIS — F411 Generalized anxiety disorder: Secondary | ICD-10-CM | POA: Diagnosis not present

## 2024-06-16 DIAGNOSIS — F330 Major depressive disorder, recurrent, mild: Secondary | ICD-10-CM | POA: Diagnosis not present

## 2024-06-16 NOTE — Progress Notes (Addendum)
      [facility] Counselor/Therapist Progress Note  Patient ID: Larry Contreras, MRN: [id number],    Date: 06/16/2024  Time Spent: 9:10 AM to 10:08 AM  Treatment Type: Individual Therapy  Reported Symptoms: Anxiousness, worries, irritability, stress, intermittent low mood, fatigue  Mental Status Exam:  Appearance:   Neat     Behavior:  Appropriate and Sharing  Motor:  Normal  Speech/Language:   Clear and Coherent and Normal Rate  Affect:  Appropriate and Congruent  Mood:  normal  Thought process:  normal  Thought content:    WNL  Sensory/Perceptual disturbances:    WNL  Orientation:  oriented to person, place, time/date, and situation  Attention:  Good  Concentration:  Good  Memory:  WNL  Fund of knowledge:   Good  Insight:    Good  Judgment:   Good  Impulse Control:  Good   Risk Assessment: Danger to Self:  No Self-injurious Behavior: No Danger to Others: No Duty to Warn:no Physical Aggression / Violence:No  Access to Firearms a concern: No  Gang Involvement:No   Subjective: Patient presented to session to address concerns of anxiety and depression.  He reported progress.  He processed experience of emotional catharsis last session, and counselor helped facilitate insight.  Patient explored thoughts and feelings around rare experience of crying, limited modeling of emotional release by parents, and counselor and patient discussed release of crying as healthy, while possibly vulnerable to client as relates limitation of control factors.  Counselor assisted patient in recognizing black-and-white thinking tendencies as with example of thinking he should not cry and grieve due to another's death, it being about them and not himself, and counselor and patient discussed how there is a relational/connection rupture to grief that does pertain to those left behind.  Counselor help facilitate insight into patient emotional dysregulation that manifest as frustration while driving after  session, and encouraged patient to practice coping skills including mindfulness and relaxation techniques to interrupt pattern.  Patient reflected on grief and loss that had prompted his emoting, and additional grief and loss that welled up for him.  Counselor held space for patient grief around his father, brother, friend, and ex.  Patient identified to be in school full-time and working on acquiring an internship, and counselor helped with strategies to progress in his efforts.  Interventions: Cognitive Behavioral Therapy, Solution-Oriented/Positive Psychology, Humanistic/Existential, and Insight-Oriented  Diagnosis:   ICD-10-CM   1. Generalized anxiety disorder  F41.1     2. Major depressive disorder, recurrent episode, mild (HCC)  F33.0       Plan: Patient is scheduled for follow-up; continue process work and developing coping skills.  STG between sessions for patient to prioritize coping skills for emotional regulation, consider journaling around grief experiences and experience of emotional release, reflect on what he perceives as his growing edges for self and self in relation.  Larry Contreras, [facility]

## 2024-07-20 ENCOUNTER — Ambulatory Visit: Admitting: Professional Counselor

## 2024-07-20 ENCOUNTER — Encounter: Payer: Self-pay | Admitting: Professional Counselor

## 2024-07-20 DIAGNOSIS — F411 Generalized anxiety disorder: Secondary | ICD-10-CM

## 2024-07-20 DIAGNOSIS — F330 Major depressive disorder, recurrent, mild: Secondary | ICD-10-CM | POA: Diagnosis not present

## 2024-07-20 NOTE — Progress Notes (Signed)
      [facility] Counselor/Therapist Progress Note  Patient ID: Larry Contreras, MRN: [id number],    Date: 07/20/2024  Time Spent: 1:07 PM to 2:03 PM  Treatment Type: Individual Therapy  Reported Symptoms: anhedonia, low mood, anxiousness, irritability, interpersonal concerns, stress, frustration, trouble relaxing  Mental Status Exam:  Appearance:   Neat     Behavior:  Appropriate, Sharing, and Motivated  Motor:  Normal  Speech/Language:   Clear and Coherent and Normal Rate  Affect:  Appropriate and Congruent  Mood:  normal  Thought process:  normal  Thought content:    WNL  Sensory/Perceptual disturbances:    WNL  Orientation:  oriented to person, place, time/date, and situation  Attention:  Good  Concentration:  Good  Memory:  WNL  Fund of knowledge:   Good  Insight:    Good  Judgment:   Good  Impulse Control:  Good   Risk Assessment: Danger to Self:  No Self-injurious Behavior: No Danger to Others: No Duty to Warn:no Physical Aggression / Violence:No  Access to Firearms a concern: No  Gang Involvement:No   Subjective: Patient presented to session to address concerns of anxiety and depression.  He reported mixed progress at this time.  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  He identified internship as reflecting his passion and values regarding accessibility for people.  He reflected on next steps in schooling with counselor.  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  Counselor affirmed patient feelings, experience, and positive developments.  Patient processed experience of his daughter's recent wedding and mixed emotions he felt.  He processed experience of variation of experiences both irritating and touching.  He reflected on his low mood as relates sense of continual endings, and counselor actively listened, helped facilitate insight and  resource patient strengths.  Interventions: Solution-Oriented/Positive Psychology, Humanistic/Existential, and Insight-Oriented  Diagnosis:   ICD-10-CM   1. Generalized anxiety disorder  F41.1     2. Major depressive disorder, recurrent episode, mild  F33.0       Plan: Patient is scheduled for follow-up; continue process work and developing coping skills.  Patient short-term goal between sessions to continue to reflect on notion of kindred spirits and connectivity with others in life past and present.  [name], [facility]

## 2024-08-23 ENCOUNTER — Ambulatory Visit: Admitting: Professional Counselor

## 2024-09-19 ENCOUNTER — Encounter: Payer: Self-pay | Admitting: Professional Counselor

## 2024-09-19 ENCOUNTER — Ambulatory Visit (INDEPENDENT_AMBULATORY_CARE_PROVIDER_SITE_OTHER): Admitting: Professional Counselor

## 2024-09-19 DIAGNOSIS — F411 Generalized anxiety disorder: Secondary | ICD-10-CM

## 2024-09-19 DIAGNOSIS — F331 Major depressive disorder, recurrent, moderate: Secondary | ICD-10-CM

## 2024-09-19 NOTE — Progress Notes (Signed)
"   °      [facility] Counselor/Therapist Progress Note  Patient ID: Larry Contreras, MRN: [id number],    Date: 09/19/2024  Time Spent: 10:16 AM - 11:23 AM   Treatment Type: Individual Therapy  Reported Symptoms: worries, restlessness, irritability, anhedonia, low mood, sleep concerns, fatigue, trouble concentrating, emotional dysregulation, stress, phase of life concerns, interpersonal concerns, health concerns   Mental Status Exam:  Appearance:   Neat     Behavior:  Appropriate, Sharing, and Motivated  Motor:  Normal  Speech/Language:   Clear and Coherent and Normal Rate  Affect:  Appropriate and Congruent  Mood:  frustrated  Thought process:  normal  Thought content:    WNL  Sensory/Perceptual disturbances:    WNL  Orientation:  oriented to person, place, time/date, and situation  Attention:  Good  Concentration:  Good  Memory:  WNL  Fund of knowledge:   Good  Insight:    Good  Judgment:   Good  Impulse Control:  Good   Risk Assessment: Danger to Self:  No Self-injurious Behavior: No Danger to Others: No Duty to Warn:no Physical Aggression / Violence:No  Access to Firearms a concern: No  Gang Involvement:No   Subjective: Patient presented to session to address concerns of anxiety and depression.  He reported mixed progress at this time.  He identified to be in a DBT group class for his schooling; Counselor and patient discussed potential helpfulness of skill set for him personally and professionally.  Patient identified considerable stress around difficulty getting his medications for narcolepsy per delivery entity.  He processed the experience of his frustration around the circumstance, and difficulty with symptoms while not having medication.  Counselor actively listened, affirmed patient feelings and experience, and discussed complexity of feelings and experience with patient.  Counselor and patient discussed the grip of certain preoccupations for patient, and challenge in his  response, and underlying origin of what triggers challenging responses.  They discussed ways in which to break the cycle of these variables and their interplay.  Counselor and patient discussed patient renewal of treatment plan.  Counselor facilitated PHQ-9 and patient scored a 14, GAD-7 and patient scored a 6; they discussed result.  They discussed patient progress and continued areas for growth and healing.  Patient gave his consent to renew treatment plan.  Interventions: Solution-Oriented/Positive Psychology, Humanistic/Existential, Insight-Oriented, and Assessments, Treat Planning, Anger Management   Diagnosis:   ICD-10-CM   1. Major depressive disorder, recurrent episode, moderate (HCC)  F33.1     2. Generalized anxiety disorder  F41.1       Plan: Patient is scheduled for follow-up; continue process work and developing coping skills.  Patient short-term goal between sessions to continue to practice loosening grip of preoccupations and being mindful of his response both internally and externally, and reflecting on triggers and underlying sense of emotional needs.  [name], [facility]                   "

## 2024-10-26 ENCOUNTER — Ambulatory Visit: Admitting: Professional Counselor

## 2024-10-26 ENCOUNTER — Encounter: Payer: Self-pay | Admitting: Professional Counselor

## 2024-10-26 DIAGNOSIS — F331 Major depressive disorder, recurrent, moderate: Secondary | ICD-10-CM | POA: Diagnosis not present

## 2024-10-26 DIAGNOSIS — F411 Generalized anxiety disorder: Secondary | ICD-10-CM

## 2024-10-26 NOTE — Progress Notes (Signed)
"   °      [facility] Counselor/Therapist Progress Note  Patient ID: Larry Contreras, MRN: [id number],    Date: 10/26/2024  Time Spent: 10:03 AM to 11:07 AM  Treatment Type: Individual Therapy  Reported Symptoms: frustration, stress, irritability, low mood, emotional dysregulation, anxiousness, grief/loss, interpersonal concerns, sense of overwhelm, trouble relaxing, worries  Mental Status Exam:  Appearance:   Neat     Behavior:  Appropriate and Sharing  Motor:  Normal  Speech/Language:   Clear and Coherent and Normal Rate  Affect:  Appropriate and Congruent  Mood:  normal  Thought process:  normal  Thought content:    WNL  Sensory/Perceptual disturbances:    WNL  Orientation:  oriented to person, place, time/date, and situation  Attention:  Good  Concentration:  Good  Memory:  WNL  Fund of knowledge:   Good  Insight:    Good  Judgment:   Good  Impulse Control:  Good   Risk Assessment: Danger to Self:  No Self-injurious Behavior: No Danger to Others: No Duty to Warn:no Physical Aggression / Violence:No  Access to Firearms a concern: No  Gang Involvement:No   Subjective: Patient presented to session to address concerns of anxiety and depression.  He reported mixed progress at this time.  He identified experiencing back pain at time of session.  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  Patient shared regarding helpful takeaways from these learning experiences.  He processed outcome to frustrating experience he was concerned about last session, and how he coped and its unfolding.  He also processed the experience of a conflict with his son, and a driving incident of concern.  Counselor actively listened, affirmed patient feelings and experience, and helped to facilitate insight into patient perspective and orientation related to these incidents, and helped him and developing mindset and strategies around  improved approaches.  Counselor and patient discussed patient emotions in circumstances as relates to trauma history of his past, particularly as relates encounter with son.  Counselor encouraged patient to consider a buffer between others projections that could help neutralize triggering of complexes, in order to increase effectiveness, soulfulness and connection.   Interventions: Dialectical Behavioral Therapy, Solution-Oriented/Positive Psychology, Humanistic/Existential, and Insight-Oriented  Diagnosis:   ICD-10-CM   1. Generalized anxiety disorder  F41.1     2. Major depressive disorder, recurrent episode, moderate (HCC)  F33.1       Plan: Patient is scheduled for follow-up; continue process work and developing coping skills.  Patient short-term goal between sessions to practice leaning into listening and reflective stance in communications with son, resource grounding and relaxation coping skills preventatively and in times of emotional dysregulation, reflect on internal cost to dysregulated response to irritations.  Larry Contreras, [facility]                   "

## 2024-11-16 ENCOUNTER — Ambulatory Visit (INDEPENDENT_AMBULATORY_CARE_PROVIDER_SITE_OTHER): Admitting: Professional Counselor

## 2024-12-14 ENCOUNTER — Ambulatory Visit: Admitting: Professional Counselor

## 2025-01-18 ENCOUNTER — Ambulatory Visit: Admitting: Professional Counselor
